# Patient Record
Sex: Male | Born: 1970 | Race: White | Hispanic: Yes | Marital: Married | Smoking: Never smoker
Health system: Southern US, Community
[De-identification: ages and names within clinical notes are randomized; demographics above are authoritative.]

## PROBLEM LIST (undated history)

## (undated) DIAGNOSIS — E119 Type 2 diabetes mellitus without complications: Secondary | ICD-10-CM

## (undated) DIAGNOSIS — I10 Essential (primary) hypertension: Secondary | ICD-10-CM

## (undated) DIAGNOSIS — E785 Hyperlipidemia, unspecified: Secondary | ICD-10-CM

## (undated) HISTORY — PX: APPENDECTOMY: SHX54

---

## 2008-01-26 ENCOUNTER — Emergency Department: Payer: Self-pay | Admitting: Emergency Medicine

## 2012-05-24 ENCOUNTER — Ambulatory Visit: Payer: Self-pay

## 2012-06-29 ENCOUNTER — Ambulatory Visit: Payer: Self-pay | Admitting: Internal Medicine

## 2014-12-09 ENCOUNTER — Emergency Department
Admission: EM | Admit: 2014-12-09 | Discharge: 2014-12-09 | Disposition: A | Discharge status: Home / Self Care | Payer: No Typology Code available for payment source | Attending: Emergency Medicine | Admitting: Emergency Medicine

## 2014-12-09 ENCOUNTER — Encounter: Payer: Self-pay | Admitting: *Deleted

## 2014-12-09 DIAGNOSIS — S3992XA Unspecified injury of lower back, initial encounter: Secondary | ICD-10-CM | POA: Diagnosis present

## 2014-12-09 DIAGNOSIS — Y998 Other external cause status: Secondary | ICD-10-CM | POA: Insufficient documentation

## 2014-12-09 DIAGNOSIS — Y9389 Activity, other specified: Secondary | ICD-10-CM | POA: Insufficient documentation

## 2014-12-09 DIAGNOSIS — Y92410 Unspecified street and highway as the place of occurrence of the external cause: Secondary | ICD-10-CM | POA: Diagnosis not present

## 2014-12-09 DIAGNOSIS — M62830 Muscle spasm of back: Secondary | ICD-10-CM

## 2014-12-09 MED ORDER — CYCLOBENZAPRINE HCL 10 MG PO TABS: 10.0000 mg | ORAL_TABLET | Freq: Once | ORAL | Status: DC

## 2014-12-09 MED ORDER — CYCLOBENZAPRINE HCL 10 MG PO TABS: 10.0000 mg | ORAL_TABLET | Freq: Three times a day (TID) | ORAL | Status: DC | PRN

## 2014-12-09 MED ORDER — CYCLOBENZAPRINE HCL 10 MG PO TABS
10.0000 mg | ORAL_TABLET | Freq: Once | ORAL | Status: AC
  Administered 2014-12-09: 10 mg via ORAL

## 2014-12-09 MED ORDER — CYCLOBENZAPRINE HCL 10 MG PO TABS
ORAL_TABLET | ORAL | Status: AC
  Administered 2014-12-09: 10 mg via ORAL
  Filled 2014-12-09: qty 1

## 2014-12-09 MED ORDER — KETOROLAC TROMETHAMINE 10 MG PO TABS
ORAL_TABLET | ORAL | Status: AC
  Administered 2014-12-09: 10 mg via ORAL
  Filled 2014-12-09: qty 1

## 2014-12-09 MED ORDER — KETOROLAC TROMETHAMINE 10 MG PO TABS
10.0000 mg | ORAL_TABLET | Freq: Once | ORAL | Status: AC
  Administered 2014-12-09: 10 mg via ORAL
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
## 2014-12-09 NOTE — Discharge Instructions (Signed)
Dolor músculoesquelético °(Musculoskeletal Pain) °El dolor musculoesquelético se siente en huesos y músculos. El dolor puede ocurrir en cualquier parte del cuerpo. El profesional que lo asiste podrá tratarlo sin conocer la causa del dolor. Lo tratará aunque las pruebas de laboratorio (sangre y orina), las radiografías y otros estudios sean normales. La causa de estos dolores puede ser un virus.  °CAUSAS °Generalmente no existe una causa definida para este trastorno. También el malestar puede deberse a la actividad excesiva. En la actividad excesiva se incluye el hacer ejercicios físicos muy intensos cuando no se está en buena forma. El dolor de huesos también puede deberse a cambios climáticos. Los huesos son sensibles a los cambios en la presión atmosférica. °INSTRUCCIONES PARA EL CUIDADO DOMICILIARIO °· Para proteger su privacidad, no se entregarán los resultados de las pruebas por teléfono. Asegúrese de conseguirlos. Consulte el modo en que podrá obtenerlos si no se lo han informado. Es su responsabilidad contar con los resultados de las pruebas. °· Utilice los medicamentos de venta libre o de prescripción para el dolor, el malestar o la fiebre, según se lo indique el profesional que lo asiste. °Si le han administrado medicamentos, no conduzca, no opere maquinarias ni herramientas eléctricas, y tampoco firme documentos legales durante 24 horas. No beba alcohol. No tome píldoras para dormir ni otros medicamentos que puedan interferir en el tratamiento. °· Podrá seguir con todas las actividades a menos que éstas le ocasionen más dolor. Cuando el dolor disminuya, es importante que gradualmente reanude toda la rutina habitual. Retome las actividades comenzando lentamente. Aumente gradualmente la intensidad y la duración de sus actividades o del ejercicio. °· Durante los períodos de dolor intenso, el reposo en cama puede ser beneficioso. Recuéstese o siéntese en la posición que le sea más cómoda. °· Coloque hielo  sobre la zona afectada. °¨ Ponga hielo en una bolsa. °¨ Colóquese una toalla entre la piel y la bolsa de hielo. °¨ Aplique el hielo durante 10 a 20 minutos 3 ó 4 veces por día. °· Si el dolor empeora, o no desaparece puede ser necesario repetir las pruebas o realizar nuevos exámenes. El profesional que lo asiste podrá requerir investigar más profundamente para encontrar la causa posible. °SOLICITE ATENCIÓN MÉDICA DE INMEDIATO SI: °· Siente que el dolor empeora y no se alivia con los medicamentos. °· Siente dolor en el pecho asociado a falta de aire, sudoración, náuseas o vómitos. °· El dolor se localiza en el abdomen. °· Comienza a sentir nuevos síntomas que parecen ser diferentes o que lo preocupan. °ASEGÚRESE DE QUE:  °· Comprende las instrucciones para el alta médica. °· Controlará su enfermedad. °· Solicitará atención médica de inmediato según las indicaciones. °Document Released: 02/02/2005 Document Revised: 07/18/2011 °ExitCare® Patient Information ©2015 ExitCare, LLC. This information is not intended to replace advice given to you by your health care provider. Make sure you discuss any questions you have with your health care provider. ° °
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
## 2015-08-02 ENCOUNTER — Emergency Department: Payer: Managed Care, Other (non HMO)

## 2015-08-02 ENCOUNTER — Encounter: Payer: Self-pay | Admitting: Emergency Medicine

## 2015-08-02 ENCOUNTER — Emergency Department
Admission: EM | Admit: 2015-08-02 | Discharge: 2015-08-02 | Disposition: A | Discharge status: Home / Self Care | Payer: Managed Care, Other (non HMO) | Attending: Emergency Medicine | Admitting: Emergency Medicine

## 2015-08-02 DIAGNOSIS — M79604 Pain in right leg: Secondary | ICD-10-CM | POA: Diagnosis present

## 2015-08-02 DIAGNOSIS — M5431 Sciatica, right side: Secondary | ICD-10-CM | POA: Diagnosis not present

## 2015-08-02 MED ORDER — OXYCODONE-ACETAMINOPHEN 5-325 MG PO TABS
1.0000 | ORAL_TABLET | ORAL | Status: DC | PRN
  Administered 2015-08-02: 1 via ORAL

## 2015-08-02 MED ORDER — HYDROMORPHONE HCL 1 MG/ML IJ SOLN
1.0000 mg | Freq: Once | INTRAMUSCULAR | Status: AC
  Administered 2015-08-02: 1 mg via INTRAMUSCULAR
  Filled 2015-08-02: qty 1

## 2015-08-02 MED ORDER — ONDANSETRON 4 MG PO TBDP
4.0000 mg | ORAL_TABLET | Freq: Once | ORAL | Status: AC
  Administered 2015-08-02: 4 mg via ORAL
  Filled 2015-08-02: qty 1

## 2015-08-02 MED ORDER — OXYCODONE-ACETAMINOPHEN 5-325 MG PO TABS: 1.0000 | ORAL_TABLET | ORAL | Status: DC | PRN

## 2015-08-02 MED ORDER — OXYCODONE-ACETAMINOPHEN 5-325 MG PO TABS
ORAL_TABLET | ORAL | Status: AC
  Filled 2015-08-02: qty 1

## 2015-08-02 NOTE — Discharge Instructions (Signed)
Return to the emergency department for any worsening pain, new weakness or numbness, any numbness of the rectal or groin area, any problems probing or urinating on yourself as these could be signs of more serious condition.  Continue your current medications, and I am adding an additional pain medicine, Percocet. Do not take additional Tylenol with this. Do not drive or operate machinery. Limit bending or lifting, and standing for a week to help you. Follow-up with her primary doctor within 1 week, you may the scheduled for imaging of the spine such as an MRI as an neck step.   Citica  (Sciatica)  La citica es Chief Technology Officer, debilidad, entumecimiento u hormigueo a lo largo del nervio citico. El nervio comienza en la zona inferior de la espalda y desciende por la parte posterior de cada pierna. El nervio controla los msculos de la parte inferior de la pierna y de la zona posterior de la rodilla, y transmite la sensibilidad a la parte posterior del muslo, la pierna y la planta del pie. La citica es un sntoma de otras afecciones mdicas. Por ejemplo, un dao a los nervios o algunas enfermedades como un disco herniado o un espoln seo en la columna vertebral, podran daarle o presionar en el nervio citico. Esto causa dolor, debilidad y otras sensaciones normalmente asociadas con la citica. Generalmente la citica afecta slo un lado del cuerpo. CAUSAS   Disco herniado o desplazado.  Enfermedad degenerativa del disco.  Un sndrome doloroso que compromete un msculo angosto de los glteos (sndrome piriforme).  Lesin o fractura plvica.  Embarazo.  Tumor (casos raros). SNTOMAS  Los sntomas pueden variar de leves a muy graves. Por lo general, los sntomas descienden desde la zona lumbar a las nalgas y la parte posterior de la pierna. Ellos son:   Hormigueo leve o dolor sordo en la parte inferior de la espalda, la pierna o la cadera.  Adormecimiento en la parte posterior de la pantorrilla o  la planta del pie.  Sensacin de [employer] zona lumbar, la pierna o la cadera.  Dolor agudo en la zona inferior de la espalda, la pierna o la cadera.  Debilidad en las piernas.  Dolor de espalda intenso que [employer] movimientos. Los sntomas pueden empeorar al toser, Engineering geologist, rer o estar sentado o parado durante [employer]. Adems, el sobrepeso puede empeorar los sntomas.  DIAGNSTICO  Su mdico le har un examen fsico para buscar los sntomas comunes de la citica. Le pedir que haga algunos movimientos o actividades que activaran el dolor del nervio citico. Para encontrar las causas de la citica podr indicarle otros estudios. Estos pueden ser:   Anlisis de [employer].  Radiografas.  Pruebas de diagnstico por imgenes, como resonancia magntica o tomografa computada. TRATAMIENTO  El tratamiento se dirige a las causas de la citica. A veces, el tratamiento no es necesario, y Chief Technology Officer y Environmental health practitioner desaparecen por s mismos. Si necesita tratamiento, su mdico puede sugerir:   Medicamentos de venta libre para Engineer, materials.  Medicamentos recetados, como antiinflamatorios, relajantes musculares o narcticos.  Aplicacin de calor o hielo en la zona del dolor.  Inyecciones de corticoides para disminuir el dolor, la irritacin y la inflamacin alrededor del nervio.  Reduccin de la [employer] perodos de [place].  Ejercicios y estiramiento del abdomen para fortalecer y Scientist, clinical (histocompatibility and immunogenetics) la flexibilidad de la columna vertebral. Su mdico puede sugerirle perder peso si el peso extra empeora el dolor de espalda.  Fisioterapia.  La  ciruga para eliminar lo que presiona o pincha el nervio, como un espoln seo o parte de una hernia de disco. INSTRUCCIONES PARA EL CUIDADO EN EL HOGAR   Slo tome medicamentos de venta libre o recetados para Primary school teachercalmar el dolor o Environmental health practitionerel malestar, segn las indicaciones de su mdico.  Aplique hielo sobre el rea dolorida durante 20 minutos 3-4 veces  por da durante los primeras 48-72 horas. Luego intente aplicar calor de la misma manera.  Haga ejercicios, elongue o realice sus actividades habituales, si no le causan ms dolor.  Cumpla con todas las sesiones de fisioterapia, segn le indique su mdico.  Cumpla con todas las visitas de control, segn le indique su mdico.  No use tacones altos o zapatos que no tengan buen apoyo.  Verifique que el colchn no sea muy blando. Un colchn firme Engineer, materialsaliviar el dolor y las Chums Cornermolestias. SOLICITE ATENCIN MDICA DE INMEDIATO SI:   Pierde el control de la vejiga o del intestino (incontinencia).  Aumenta la debilidad en la zona inferior de la espalda, la pelvis, las nalgas o las piernas.  Siente irritacin o inflamacin en la espalda.  Tiene sensacin de ardor al ConocoPhillipsorinar.  El dolor empeora cuando se acuesta o lo despierta por la noche.  El dolor es peor del que experiment en el pasado.  Dura ms de 4 semanas.  Pierde peso sin motivo de Jaspermanera sbita. ASEGRESE DE QUE:   Comprende estas instrucciones.  Controlar su enfermedad.  Solicitar ayuda de inmediato si no mejora o si empeora.   Esta informacin no tiene Theme park managercomo fin reemplazar el consejo del mdico. Asegrese de hacerle al mdico cualquier pregunta que tenga.   Document Released: 04/25/2005 Document Revised: 01/14/2015 Elsevier Interactive Patient Education [employer]2016 Elsevier Inc.

## 2015-08-02 NOTE — ED Provider Notes (Signed)
[facility] Emergency Department Provider Note   ____________________________________________  Time seen: Approximately 6 AM I have reviewed the triage vital signs and the triage nursing note.  HISTORY  Chief Complaint Leg Pain   Historian Patient  HPI Victor Campos is a 45 y.o. male who is here for evaluation of right leg pain. Patient states that pain started in his right buttock and extends down his leg about 2 weeks ago. He saw his primary care physician who started him on Naprosyn. Previous to that he had been taking ibuprofen. He didn't go back for reevaluation on this past Thursday just a few days ago, and was started on prednisone.  He does bend and lift for his job, but does not report a specific injury or overuse. No history of degenerative disc disease, or prior radicular pain. No fever.  He has been having some paresthesias, but no weakness. No incontinence of stool or urine. Pain is worse when he sits or bends. He is currently having moderate to severe pain. He is pointing now more to his posterior/posterior knee area. There is been no skin rash.    History reviewed. No pertinent past medical history.  There are no active problems to display for this patient.   History reviewed. No pertinent past surgical history.  Current Outpatient Rx  Name  Route  Sig  Dispense  Refill  . cyclobenzaprine (FLEXERIL) 10 MG tablet   Oral   Take 1 tablet (10 mg total) by mouth 3 (three) times daily as needed for muscle spasms.   30 tablet   0   . oxyCODONE-acetaminophen (ROXICET) 5-325 MG tablet   Oral   Take 1 tablet by mouth every 4 (four) hours as needed for severe pain.   10 tablet   0     Allergies Penicillins  No family history on file.  Social History Social History  Substance Use Topics  . Smoking status: Never Smoker   . Smokeless tobacco: None  . Alcohol Use: No    Review of Systems  Constitutional: Negative for  fever. Eyes: Negative for visual changes. ENT: Negative for sore throat. Cardiovascular: Negative for chest pain. Respiratory: Negative for shortness of breath. Gastrointestinal: Negative for abdominal pain, vomiting and diarrhea. Genitourinary: Negative for dysuria. Musculoskeletal: Mild right-sided low back pain more towards the buttock area. Skin: Negative for rash. Neurological: Negative for headache. 10 point Review of Systems otherwise negative ____________________________________________   PHYSICAL EXAM:  VITAL SIGNS: ED Triage Vitals  Enc Vitals Group     BP 08/02/15 0143 151/97 mmHg     Pulse Rate 08/02/15 0143 99     Resp 08/02/15 0143 18     Temp 08/02/15 0143 97.8 F (36.6 C)     Temp Source 08/02/15 0143 Oral     SpO2 08/02/15 0143 95 %     Weight 08/02/15 0143 199 lb (90.266 kg)     Height 08/02/15 0143  (1.549 m)     Head Cir --      Peak Flow --      Pain Score 08/02/15 0144 9     Pain Loc --      Pain Edu? --      Excl. in GC? --      Constitutional: Alert and oriented. Well appearing overall but pacing and has trouble sitting down due to pain in his right lower extremity. HEENT   Head: Normocephalic and atraumatic.      Eyes: Conjunctivae are normal.  PERRL. Normal extraocular movements.      Ears:         Nose: No congestion/rhinnorhea.   Mouth/Throat: Mucous membranes are moist.   Neck: No stridor. Cardiovascular/Chest: Normal rate, regular rhythm.  No murmurs, rubs, or gallops. Respiratory: Normal respiratory effort without tachypnea nor retractions. Breath sounds are clear and equal bilaterally. No wheezes/rales/rhonchi. Gastrointestinal: Soft. No distention, no guarding, no rebound. Nontender.    Genitourinary/rectal:Deferred Musculoskeletal: Nontender with normal range of motion in all extremities. No joint effusions.  No lower extremity tenderness.  No edema. Neurologic:  Normal speech and language. Able to walk. Subjective  paresthesias right lower extremity. No weakness appreciated. Skin:  Skin is warm, dry and intact. No rash noted. Psychiatric: Mood and affect are normal. Speech and behavior are normal. Patient exhibits appropriate insight and judgment.  ____________________________________________   EKG I, Governor Rooksebecca Trestin Vences, MD, the attending physician have personally viewed and interpreted all ECGs.  None ____________________________________________  LABS (pertinent positives/negatives)  None  ____________________________________________  RADIOLOGY All Xrays were viewed by me. Imaging interpreted by Radiologist.  Ultrasound lower extremity right: No evidence of DVT  Lumbar spine x-ray: Negative __________________________________________  PROCEDURES  Procedure(s) performed: None  Critical Care performed: None  ____________________________________________   ED COURSE / ASSESSMENT AND PLAN  Pertinent labs & imaging results that were available during my care of the patient were reviewed by me and considered in my medical decision making (see chart for details).  The patient's description of symptoms and physical exam are most consistent with right-sided sciatica. He doesn't have any historical features or findings that make me concerned about a more central process such as cauda equina, or other spinal emergency.  He does have a complaint of some paresthesias in the right lower extremity, which I do think are probably still related to sciatica.  In terms of pain control, he is on prednisone and anti-inflammatory, I will add Percocet for some short-term relief. Patient to follow up with his primary care doctor this week, and I discussed with him likely next step would be MRI of the spine.    CONSULTATIONS:   None   Patient / Family / Caregiver informed of clinical course, medical decision-making process, and agree with plan.   I discussed return precautions, follow-up instructions, and  discharged instructions with patient and/or family.   ___________________________________________   FINAL CLINICAL IMPRESSION(S) / ED DIAGNOSES   Final diagnoses:  Sciatica, right              Note: This dictation was prepared with Dragon dictation. Any transcriptional errors that result from this process are unintentional   Governor Rooksebecca Denisha Hoel, MD 08/02/15 [phone]

## 2015-08-02 NOTE — ED Notes (Signed)
Unable to E sign. Pt verbalized understanding of DC instructions and all paperwork.  All questions answered.

## 2015-08-02 NOTE — ED Notes (Addendum)
Patient states that he developed right leg pain about 2 weeks ago that starts in his hip and radiated down his right leg. Patient states that he has seen his pcp and started on naproxen with no improvement. Patient reports that he went back to his pcp on Thursday and was started on prednisone and the pain is worse tonight. Patient denies any injury.

## 2015-08-02 NOTE — ED Notes (Signed)
Patient transported to Ultrasound 

## 2017-04-23 IMAGING — US US EXTREM LOW VENOUS*R*
1 series · 13 of 24 positions shown · non-contrast
Comparison: None.

CLINICAL DATA: behind the knee leg pain



[Series 1: us extrem low venous*right* · 0.08mm/px · 13 of 31 slices shown]
[im 1/31]
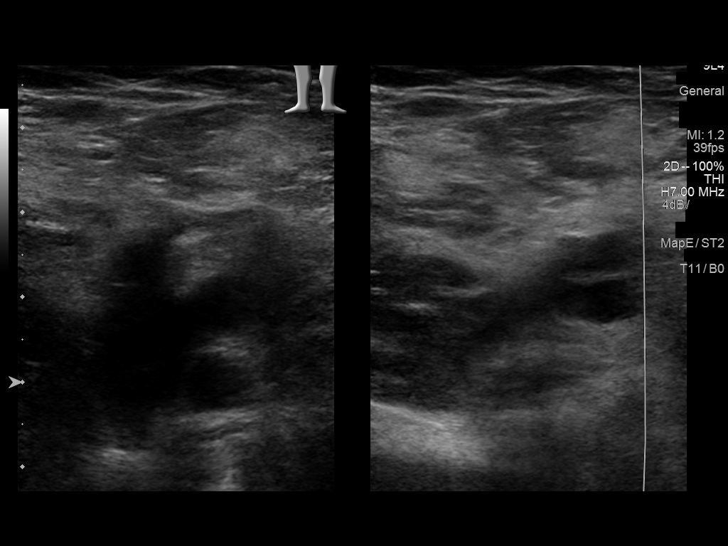
[im 3/31]
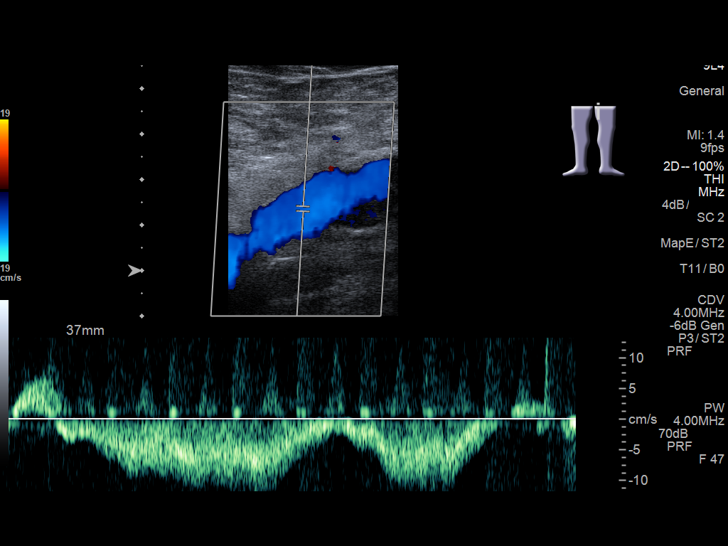
[im 6/31]
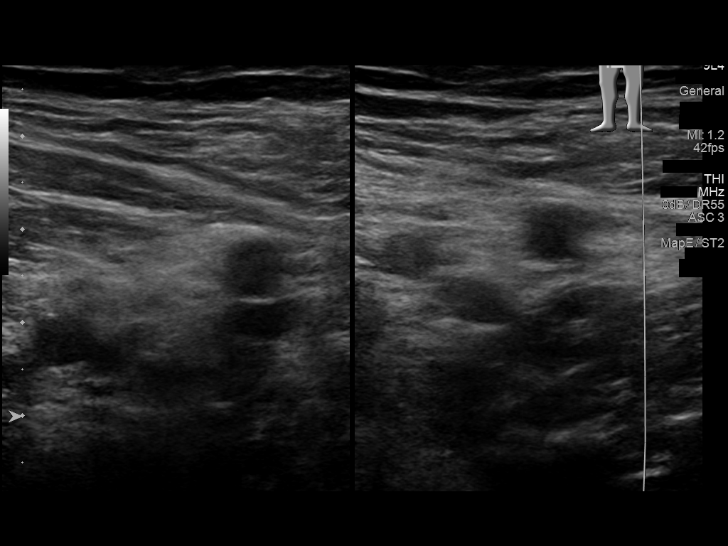
[im 8/31]
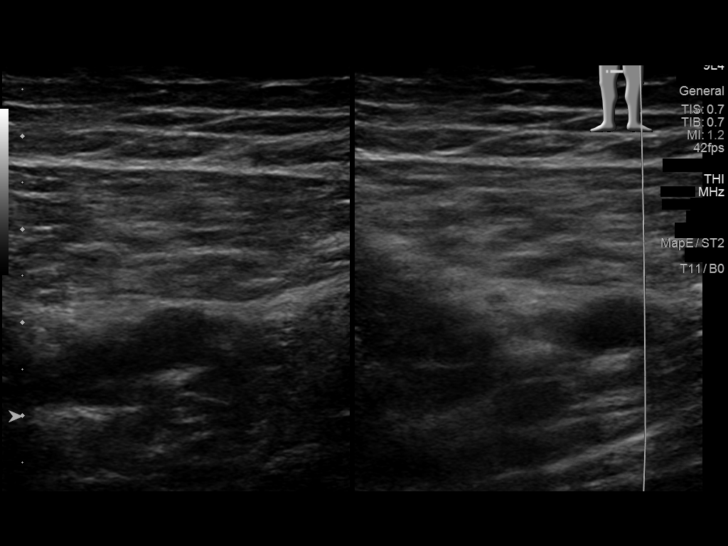
[im 11/31]
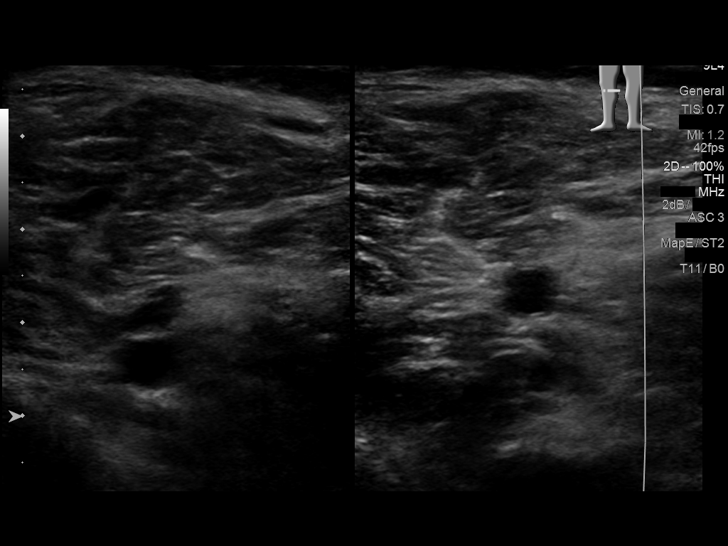
[im 14/31]
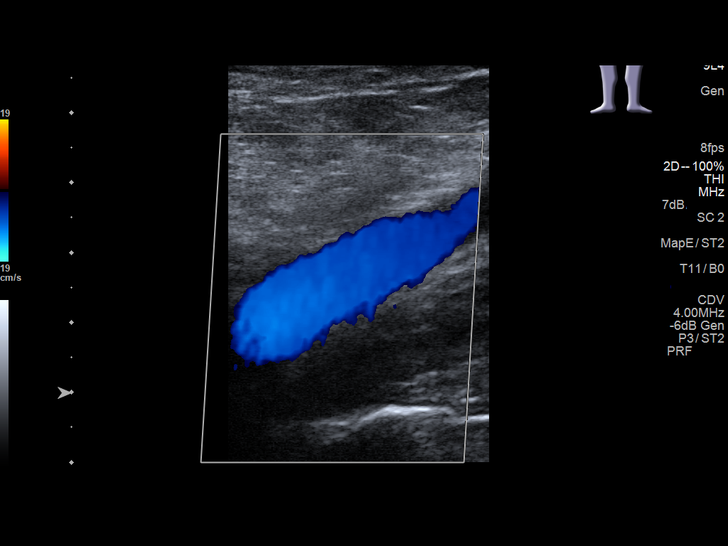
[im 16/31]
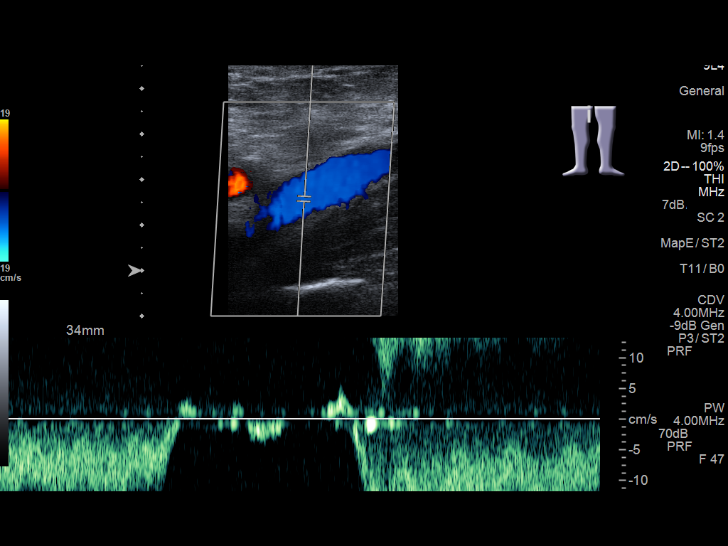
[im 17/31]
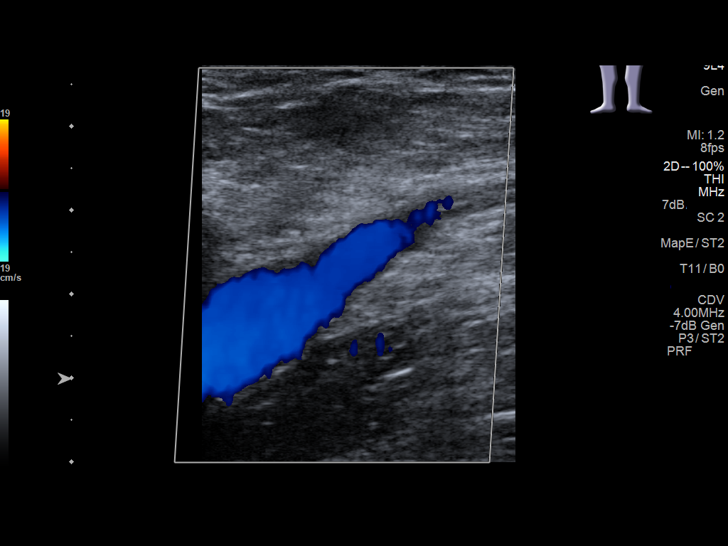
[im 20/31]
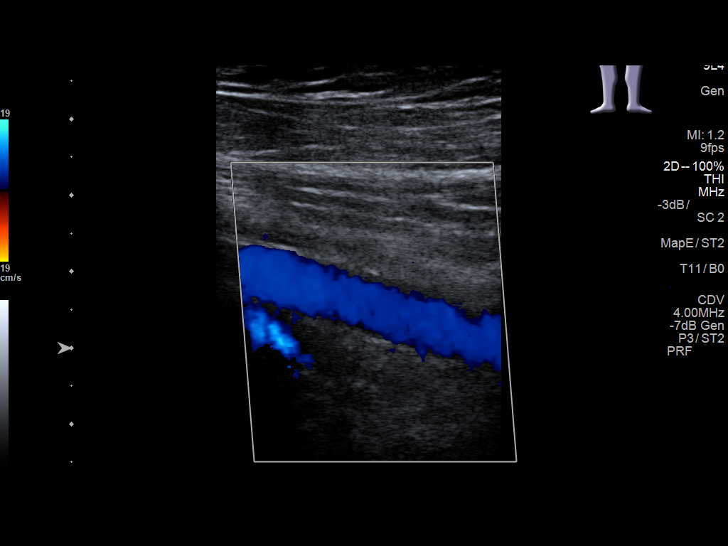
[im 23/31]
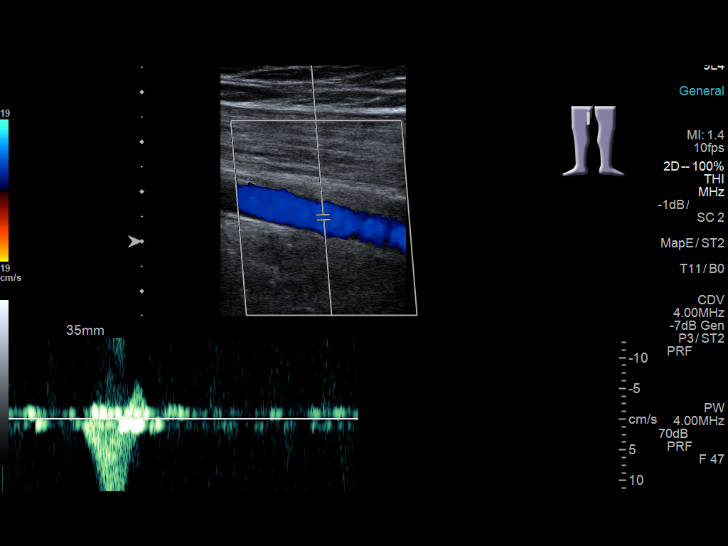
[im 25/31]
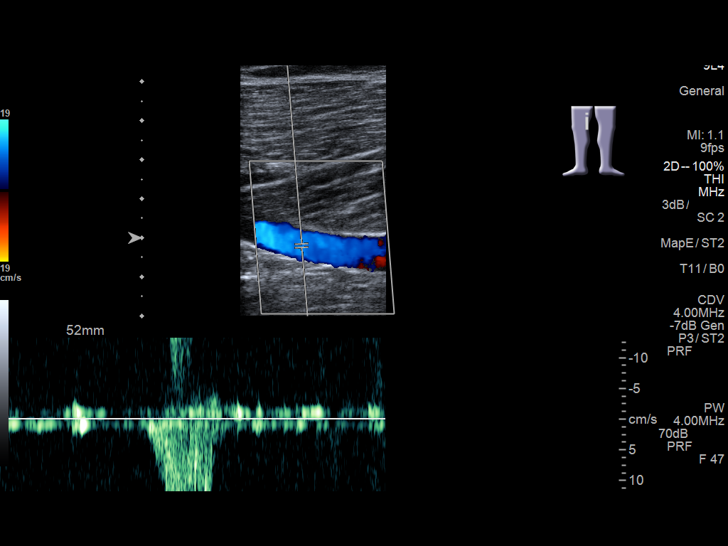
[im 28/31]
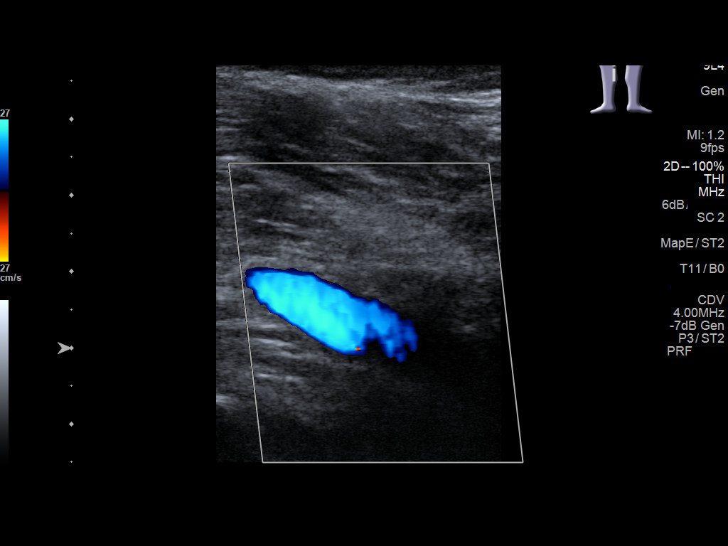
[im 31/31]
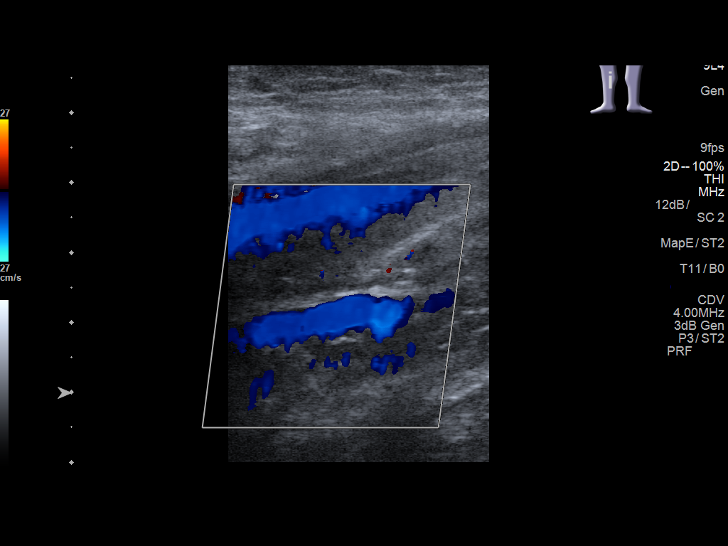

[13 of 24 positions shown; findings below may reference images not displayed]

FINDINGS: Contralateral Common Femoral Vein: Respiratory phasicity is normal
and symmetric with the symptomatic side. No evidence of thrombus.
Normal compressibility.

Common Femoral Vein: No evidence of thrombus. Normal
compressibility, respiratory phasicity and response to augmentation.

Saphenofemoral Junction: No evidence of thrombus. Normal
compressibility and flow on color Doppler imaging.

Profunda Femoral Vein: No evidence of thrombus. Normal
compressibility and flow on color Doppler imaging.

Femoral Vein: No evidence of thrombus. Normal compressibility,
respiratory phasicity and response to augmentation.

Popliteal Vein: No evidence of thrombus. Normal compressibility,
respiratory phasicity and response to augmentation.

Calf Veins: No evidence of thrombus. Normal compressibility and flow
on color Doppler imaging.

Superficial Great Saphenous Vein: No evidence of thrombus. Normal
compressibility and flow on color Doppler imaging.

Venous Reflux:  None.

Other Findings:  None.
IMPRESSION: No evidence of deep venous thrombosis.

## 2017-08-20 IMAGING — CR DG LUMBAR SPINE 2-3V
3 series · 3 of 3 positions shown · non-contrast
Comparison: None.

CLINICAL DATA: Patient states that he developed right leg pain
about 2 weeks ago that starts in his hip and radiated down his right
leg. Patient states that he has seen his pcp and started on naproxen
with no improvement.

EXAM:
LUMBAR SPINE - 2-3 VIEW

[l-spine ap]
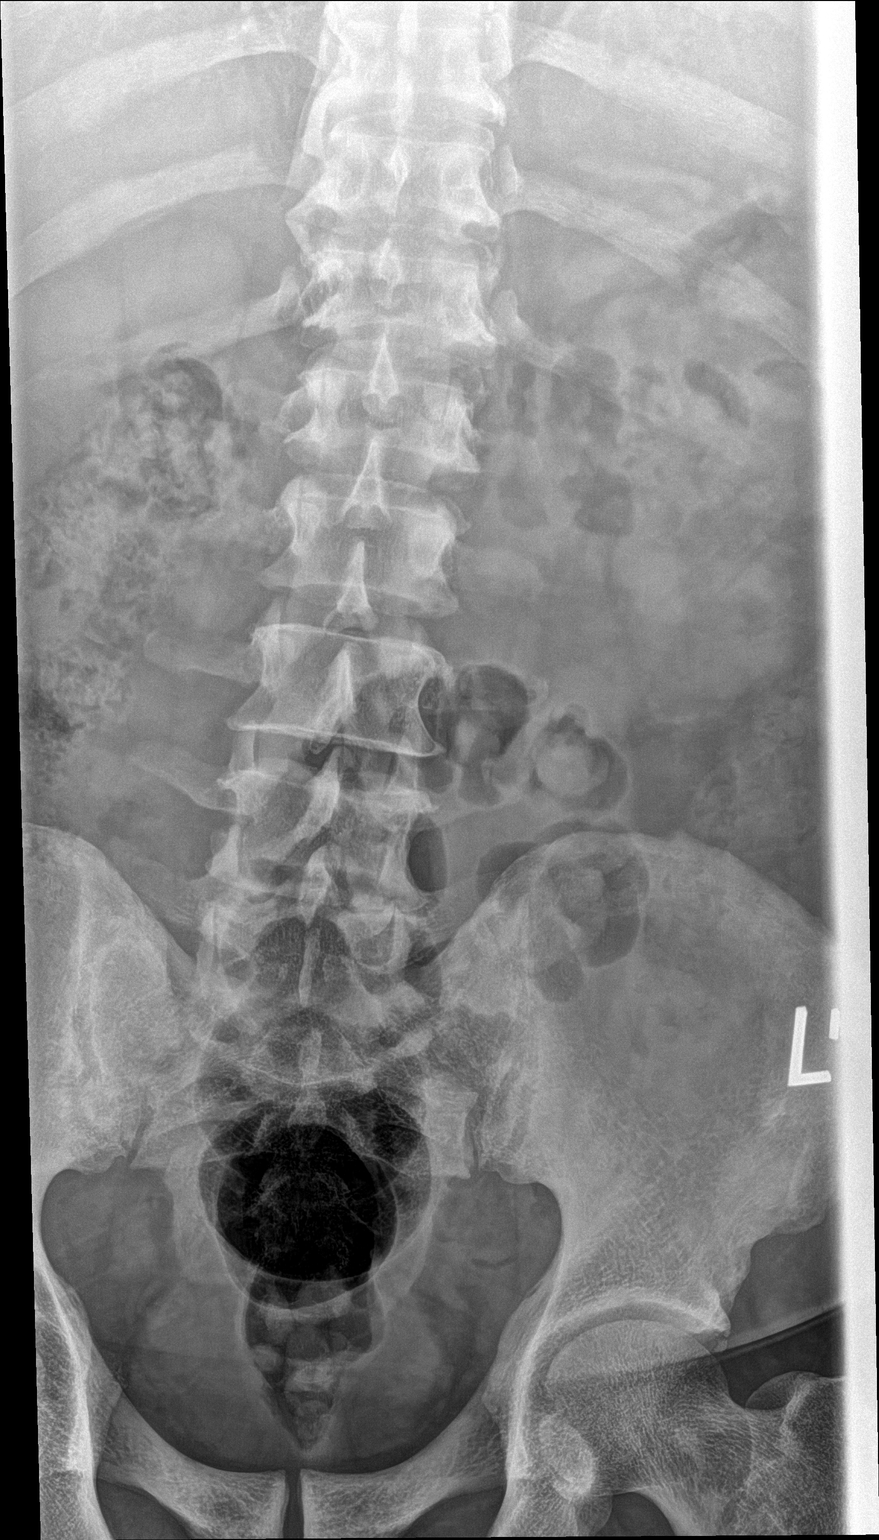

[l-spine lat]
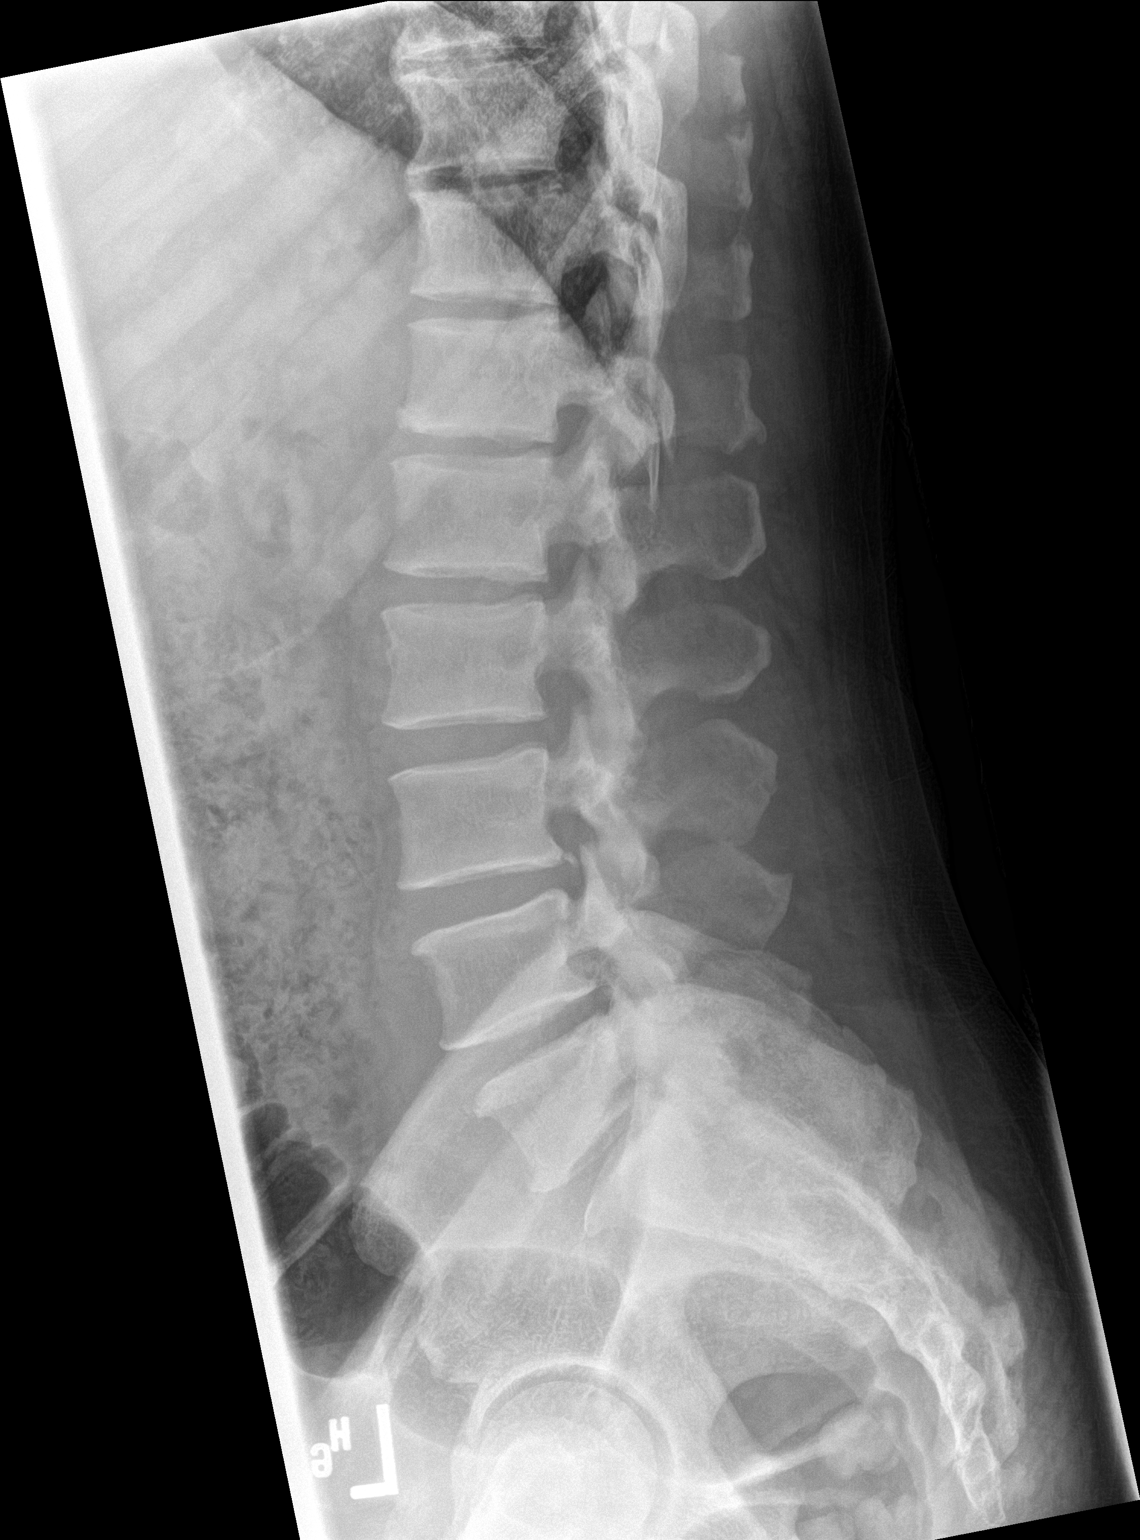

[l-spine spot]
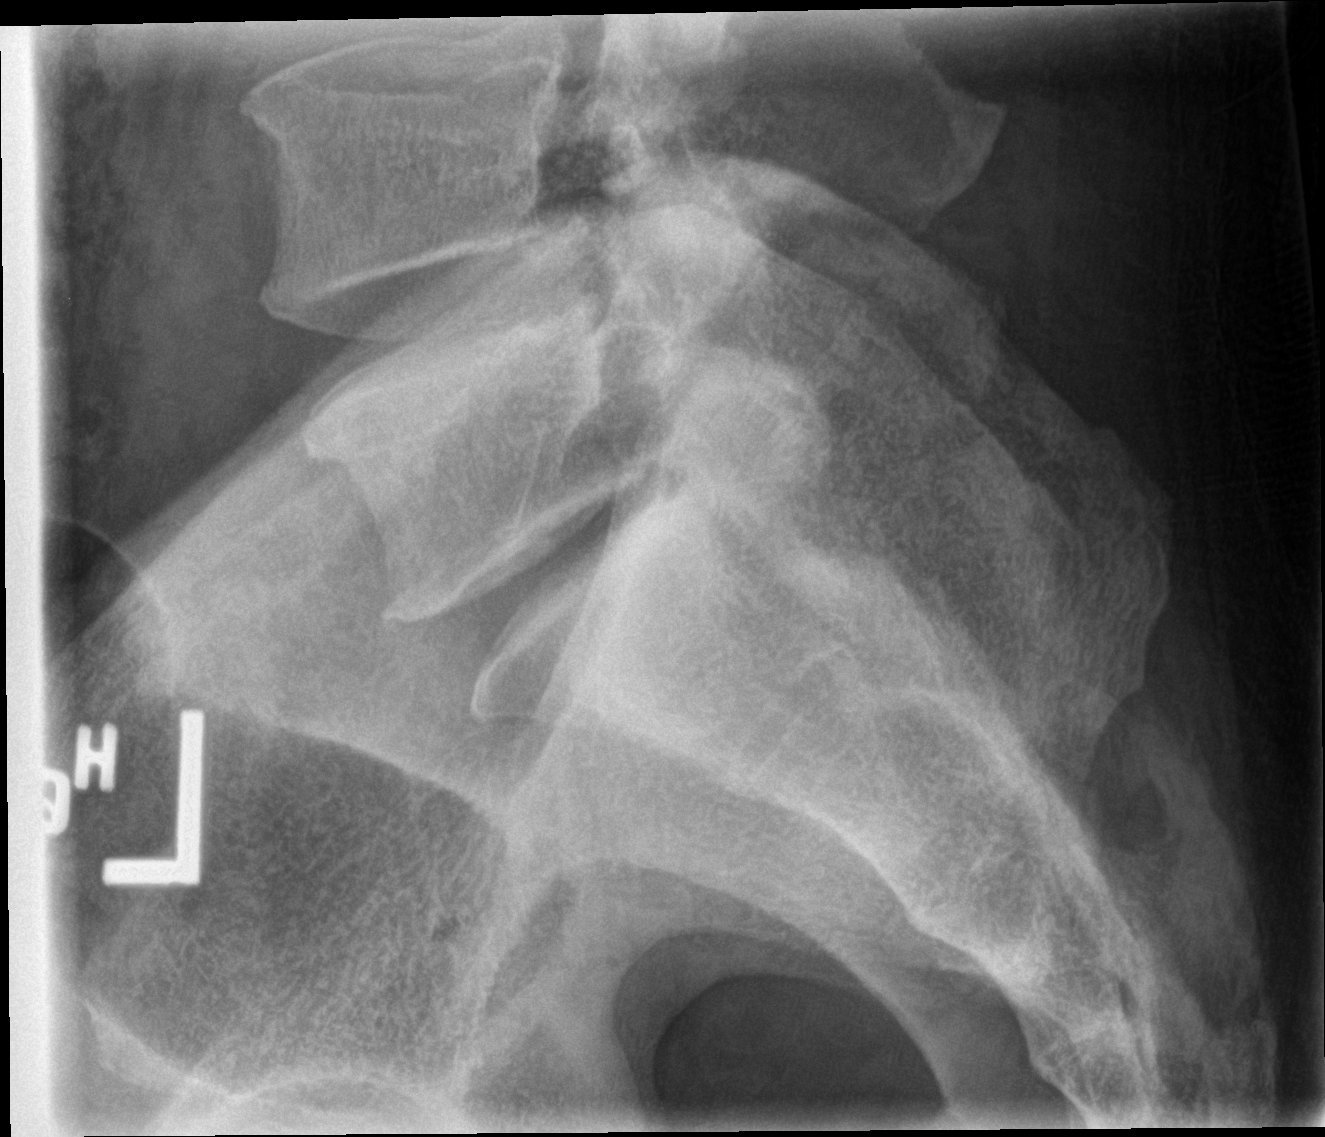

[3 of 3 positions shown; findings below may reference images not displayed]

FINDINGS: There is no evidence of lumbar spine fracture. Alignment is normal.
Intervertebral disc spaces are maintained except for slight
narrowing at L4-5 and L5-S1. Annular calcification L3-L4.
IMPRESSION: Negative.

## 2019-07-23 ENCOUNTER — Encounter: Payer: Self-pay | Admitting: Family Medicine

## 2020-05-29 ENCOUNTER — Other Ambulatory Visit: Payer: Self-pay

## 2020-05-29 ENCOUNTER — Telehealth (INDEPENDENT_AMBULATORY_CARE_PROVIDER_SITE_OTHER): Payer: Self-pay | Admitting: Gastroenterology

## 2020-05-29 DIAGNOSIS — Z1211 Encounter for screening for malignant neoplasm of colon: Secondary | ICD-10-CM

## 2020-05-29 MED ORDER — NA SULFATE-K SULFATE-MG SULF 17.5-3.13-1.6 GM/177ML PO SOLN: 1.0000 | Freq: Once | ORAL | 0 refills | Status: AC

## 2020-05-29 NOTE — Progress Notes (Signed)
Gastroenterology Pre-Procedure Review  Request Date: Thursday 06/11/20 Requesting Physician: Dr. Allegra Lai  PATIENT REVIEW QUESTIONS: The patient responded to the following health history questions as indicated:    1. Are you having any GI issues? no 2. Do you have a personal history of Polyps? NO 3. Do you have a family history of Colon Cancer or Polyps? no 4. Diabetes Mellitus? no 5. Joint replacements in the past 12 months?no 6. Major health problems in the past 3 months?no 7. Any artificial heart valves, MVP, or defibrillator?no    MEDICATIONS & ALLERGIES:    Patient reports the following regarding taking any anticoagulation/antiplatelet therapy:   Plavix, Coumadin, Eliquis, Xarelto, Lovenox, Pradaxa, Brilinta, or Effient? no Aspirin? no  Patient confirms/reports the following medications:  Current Outpatient Medications  Medication Sig Dispense Refill  . aspirin EC 81 MG tablet Take 81 mg by mouth daily. Swallow whole.    Marland Kitchen atorvastatin (LIPITOR) 40 MG tablet Take 40 mg by mouth daily.    . cyclobenzaprine (FLEXERIL) 10 MG tablet Take 1 tablet (10 mg total) by mouth 3 (three) times daily as needed for muscle spasms. 30 tablet 0  . IBUPROFEN PO Take by mouth.    Marland Kitchen lisinopril (ZESTRIL) 2.5 MG tablet Take 2.5 mg by mouth daily.    . metFORMIN (GLUCOPHAGE) 500 MG tablet Take by mouth 2 (two) times daily with a meal.    . oxyCODONE-acetaminophen (ROXICET) 5-325 MG tablet Take 1 tablet by mouth every 4 (four) hours as needed for severe pain. (Patient not taking: No sig reported) 10 tablet 0   No current facility-administered medications for this visit.    Patient confirms/reports the following allergies:  Allergies  Allergen Reactions  . Penicillins Rash    No orders of the defined types were placed in this encounter.   AUTHORIZATION INFORMATION Primary Insurance: 1D#: Group #:  Secondary Insurance: 1D#: Group #:  SCHEDULE INFORMATION: Date: Thursday  06/11/20 Time: Location:[facility]

## 2020-06-02 ENCOUNTER — Other Ambulatory Visit: Payer: Self-pay

## 2020-06-02 ENCOUNTER — Encounter: Payer: Self-pay | Admitting: Family Medicine

## 2020-06-02 DIAGNOSIS — Z1211 Encounter for screening for malignant neoplasm of colon: Secondary | ICD-10-CM

## 2020-06-02 MED ORDER — NA SULFATE-K SULFATE-MG SULF 17.5-3.13-1.6 GM/177ML PO SOLN: 1.0000 | Freq: Once | ORAL | 0 refills | Status: AC

## 2020-06-09 ENCOUNTER — Other Ambulatory Visit: Payer: Self-pay

## 2020-06-09 ENCOUNTER — Other Ambulatory Visit
Admission: RE | Admit: 2020-06-09 | Discharge: 2020-06-09 | Disposition: A | Discharge status: Home / Self Care | Payer: BC Managed Care – PPO | Source: Ambulatory Visit | Attending: Gastroenterology | Admitting: Gastroenterology

## 2020-06-09 DIAGNOSIS — Z79899 Other long term (current) drug therapy: Secondary | ICD-10-CM | POA: Diagnosis not present

## 2020-06-09 DIAGNOSIS — Z1211 Encounter for screening for malignant neoplasm of colon: Secondary | ICD-10-CM | POA: Diagnosis present

## 2020-06-09 DIAGNOSIS — Z01812 Encounter for preprocedural laboratory examination: Secondary | ICD-10-CM | POA: Insufficient documentation

## 2020-06-09 DIAGNOSIS — Z7984 Long term (current) use of oral hypoglycemic drugs: Secondary | ICD-10-CM | POA: Diagnosis not present

## 2020-06-09 DIAGNOSIS — Z20822 Contact with and (suspected) exposure to covid-19: Secondary | ICD-10-CM | POA: Diagnosis not present

## 2020-06-09 DIAGNOSIS — Z791 Long term (current) use of non-steroidal anti-inflammatories (NSAID): Secondary | ICD-10-CM | POA: Diagnosis not present

## 2020-06-09 DIAGNOSIS — Z880 Allergy status to penicillin: Secondary | ICD-10-CM | POA: Diagnosis not present

## 2020-06-09 DIAGNOSIS — Z7982 Long term (current) use of aspirin: Secondary | ICD-10-CM | POA: Diagnosis not present

## 2020-06-09 DIAGNOSIS — K644 Residual hemorrhoidal skin tags: Secondary | ICD-10-CM | POA: Diagnosis not present

## 2020-06-09 LAB — SARS CORONAVIRUS 2 (TAT 6-24 HRS): SARS Coronavirus 2: NEGATIVE

## 2020-06-10 ENCOUNTER — Encounter: Payer: Self-pay | Admitting: Gastroenterology

## 2020-06-11 ENCOUNTER — Encounter
Admission: RE | Disposition: A | Discharge status: Home / Self Care | Payer: Self-pay | Source: Ambulatory Visit | Attending: Gastroenterology

## 2020-06-11 ENCOUNTER — Ambulatory Visit
Admission: RE | Admit: 2020-06-11 | Discharge: 2020-06-11 | Disposition: A | Discharge status: Home / Self Care | Payer: BC Managed Care – PPO | Source: Ambulatory Visit | Attending: Gastroenterology | Admitting: Gastroenterology

## 2020-06-11 ENCOUNTER — Encounter: Payer: Self-pay | Admitting: Gastroenterology

## 2020-06-11 ENCOUNTER — Ambulatory Visit: Payer: BC Managed Care – PPO | Admitting: Certified Registered Nurse Anesthetist

## 2020-06-11 ENCOUNTER — Other Ambulatory Visit: Payer: Self-pay

## 2020-06-11 DIAGNOSIS — Z79899 Other long term (current) drug therapy: Secondary | ICD-10-CM | POA: Insufficient documentation

## 2020-06-11 DIAGNOSIS — Z1211 Encounter for screening for malignant neoplasm of colon: Secondary | ICD-10-CM

## 2020-06-11 DIAGNOSIS — Z20822 Contact with and (suspected) exposure to covid-19: Secondary | ICD-10-CM | POA: Insufficient documentation

## 2020-06-11 DIAGNOSIS — K644 Residual hemorrhoidal skin tags: Secondary | ICD-10-CM | POA: Insufficient documentation

## 2020-06-11 DIAGNOSIS — Z880 Allergy status to penicillin: Secondary | ICD-10-CM | POA: Insufficient documentation

## 2020-06-11 DIAGNOSIS — Z7982 Long term (current) use of aspirin: Secondary | ICD-10-CM | POA: Insufficient documentation

## 2020-06-11 DIAGNOSIS — Z7984 Long term (current) use of oral hypoglycemic drugs: Secondary | ICD-10-CM | POA: Insufficient documentation

## 2020-06-11 DIAGNOSIS — Z791 Long term (current) use of non-steroidal anti-inflammatories (NSAID): Secondary | ICD-10-CM | POA: Insufficient documentation

## 2020-06-11 HISTORY — DX: Type 2 diabetes mellitus without complications: E11.9

## 2020-06-11 HISTORY — DX: Essential (primary) hypertension: I10

## 2020-06-11 HISTORY — PX: COLONOSCOPY WITH PROPOFOL: SHX5780

## 2020-06-11 HISTORY — DX: Hyperlipidemia, unspecified: E78.5

## 2020-06-11 LAB — GLUCOSE, CAPILLARY: Glucose-Capillary: 83 mg/dL (ref 70–99)

## 2020-06-11 SURGERY — COLONOSCOPY WITH PROPOFOL
Anesthesia: General

## 2020-06-11 MED ORDER — LIDOCAINE HCL (CARDIAC) PF 100 MG/5ML IV SOSY
PREFILLED_SYRINGE | INTRAVENOUS | Status: DC | PRN
  Administered 2020-06-11: 50 mg via INTRAVENOUS

## 2020-06-11 MED ORDER — PROPOFOL 500 MG/50ML IV EMUL
INTRAVENOUS | Status: AC
  Filled 2020-06-11: qty 50

## 2020-06-11 MED ORDER — SODIUM CHLORIDE 0.9 % IV SOLN: INTRAVENOUS | Status: DC

## 2020-06-11 MED ORDER — LIDOCAINE HCL (PF) 2 % IJ SOLN
INTRAMUSCULAR | Status: AC
  Filled 2020-06-11: qty 5

## 2020-06-11 MED ORDER — PROPOFOL 500 MG/50ML IV EMUL
INTRAVENOUS | Status: DC | PRN
  Administered 2020-06-11: 150 ug/kg/min via INTRAVENOUS

## 2020-06-11 MED ORDER — PROPOFOL 10 MG/ML IV BOLUS
INTRAVENOUS | Status: DC | PRN
  Administered 2020-06-11: 60 mg via INTRAVENOUS

## 2020-06-11 NOTE — Transfer of Care (Signed)
Immediate Anesthesia Transfer of Care Note  Patient: Victor Campos  Procedure(s) Performed: COLONOSCOPY WITH PROPOFOL (N/A )  Patient Location: PACU  Anesthesia Type:General  Level of Consciousness: drowsy  Airway & Oxygen Therapy: Patient Spontanous Breathing  Post-op Assessment: Report given to RN and Post -op Vital signs reviewed and stable  Post vital signs: Reviewed and stable  Last Vitals:  Vitals Value Taken Time  BP 84/55 06/11/20 0918  Temp    Pulse 76 06/11/20 0919  Resp 16 06/11/20 0919  SpO2 97 % 06/11/20 0919  Vitals shown include unvalidated device data.  Last Pain:  Vitals:   06/11/20 0752  TempSrc: Temporal  PainSc: 0-No pain         Complications: No complications documented.

## 2020-06-11 NOTE — Op Note (Signed)
[facility] Patient Name: Victor Campos Procedure Date: 06/11/2020 8:49 AM MRN: [id number] Account #: [id number] Date of Birth: [birth year] Admit Type: Outpatient Age: 50 Room: [facility] ENDO ROOM 4 Gender: Male Note Status: Finalized Procedure:             Colonoscopy Indications:           Screening for colorectal malignant neoplasm, This is                         the patient's first colonoscopy Providers:             Toney Reil MD, MD Referring MD:          Sylvie Farrier. Aycock MD (Referring MD) Medicines:             General Anesthesia Complications:         No immediate complications. Estimated blood loss: None. Procedure:             Pre-Anesthesia Assessment:                        - Prior to the procedure, a History and Physical was                         performed, and patient medications and allergies were                         reviewed. The patient is competent. The risks and                         benefits of the procedure and the sedation options and                         risks were discussed with the patient. All questions                         were answered and informed consent was obtained.                         Patient identification and proposed procedure were                         verified by the physician, the nurse, the                         anesthesiologist, the anesthetist and the technician                         in the pre-procedure area in the procedure room in the                         endoscopy suite. Mental Status Examination: alert and                         oriented. Airway Examination: normal oropharyngeal                         airway and neck mobility. Respiratory Examination:  clear to auscultation. CV Examination: normal.                         Prophylactic Antibiotics: The patient does not require                         prophylactic antibiotics. Prior Anticoagulants:  The                         patient has taken no previous anticoagulant or                         antiplatelet agents. ASA Grade Assessment: II - A                         patient with mild systemic disease. After reviewing                         the risks and benefits, the patient was deemed in                         satisfactory condition to undergo the procedure. The                         anesthesia plan was to use general anesthesia.                         Immediately prior to administration of medications,                         the patient was re-assessed for adequacy to receive                         sedatives. The heart rate, respiratory rate, oxygen                         saturations, blood pressure, adequacy of pulmonary                         ventilation, and response to care were monitored                         throughout the procedure. The physical status of the                         patient was re-assessed after the procedure.                        After obtaining informed consent, the colonoscope was                         passed under direct vision. Throughout the procedure,                         the patient's blood pressure, pulse, and oxygen                         saturations were monitored continuously. The  Colonoscope was introduced through the anus and                         advanced to the the cecum, identified by appendiceal                         orifice and ileocecal valve. The colonoscopy was                         performed without difficulty. The patient tolerated                         the procedure well. The quality of the bowel                         preparation was evaluated using the BBPS [facility] Bowel                         Preparation Scale) with scores of: Right Colon = 3,                         Transverse Colon = 3 and Left Colon = 3 (entire mucosa                         seen well with no residual  staining, small fragments                         of stool or opaque liquid). The total BBPS score                         equals 9. Findings:      The perianal and digital rectal examinations were normal. Pertinent       negatives include normal sphincter tone and no palpable rectal lesions.      The entire examined colon appeared normal.      Non-bleeding external hemorrhoids were found during retroflexion. The       hemorrhoids were medium-sized. Impression:            - The entire examined colon is normal.                        - Non-bleeding external hemorrhoids.                        - No specimens collected. Recommendation:        - Discharge patient to home (with escort).                        - Resume previous diet today.                        - Continue present medications.                        - Repeat colonoscopy in 10 years for screening                         purposes. Procedure Code(s):     --- Professional ---  A8341, Colorectal cancer screening; colonoscopy on                         individual not meeting criteria for high risk Diagnosis Code(s):     --- Professional ---                        Z12.11, Encounter for screening for malignant neoplasm                         of colon                        K64.4, Residual hemorrhoidal skin tags CPT copyright 2019 [employer]. All rights reserved. The codes documented in this report are preliminary and upon coder review may  be revised to meet current compliance requirements. Dr. Libby Maw Toney Reil MD, MD 06/11/2020 9:16:28 AM This report has been signed electronically. Number of Addenda: 0 Note Initiated On: 06/11/2020 8:49 AM Scope Withdrawal Time: 0 hours 13 minutes 58 seconds  Total Procedure Duration: 0 hours 16 minutes 3 seconds  Estimated Blood Loss:  Estimated blood loss: none.      [facility]

## 2020-06-11 NOTE — Anesthesia Preprocedure Evaluation (Signed)
Anesthesia Evaluation  Patient identified by MRN, date of birth, ID band Patient awake    Reviewed: Allergy & Precautions, H&P , NPO status , Patient's Chart, lab work & pertinent test results, reviewed documented beta blocker date and time   History of Anesthesia Complications Negative for: history of anesthetic complications  Airway Mallampati: III  TM Distance: >3 FB Neck ROM: full    Dental  (+) Dental Advidsory Given, Caps, Teeth Intact   Pulmonary neg pulmonary ROS,    Pulmonary exam normal breath sounds clear to auscultation       Cardiovascular Exercise Tolerance: Good hypertension, (-) angina(-) Past MI and (-) Cardiac Stents Normal cardiovascular exam(-) dysrhythmias (-) Valvular Problems/Murmurs Rhythm:regular Rate:Normal     Neuro/Psych negative neurological ROS  negative psych ROS   GI/Hepatic negative GI ROS, Neg liver ROS,   Endo/Other  diabetes  Renal/GU negative Renal ROS  negative genitourinary   Musculoskeletal   Abdominal   Peds  Hematology negative hematology ROS (+)   Anesthesia Other Findings Past Medical History: No date: Diabetes mellitus without complication (HCC) No date: Hyperlipemia No date: Hypertension   Reproductive/Obstetrics negative OB ROS                             Anesthesia Physical Anesthesia Plan  ASA: II  Anesthesia Plan: General   Post-op Pain Management:    Induction: Intravenous  PONV Risk Score and Plan: 2 and TIVA and Propofol infusion  Airway Management Planned: Natural Airway and Nasal Cannula  Additional Equipment:   Intra-op Plan:   Post-operative Plan:   Informed Consent: I have reviewed the patients History and Physical, chart, labs and discussed the procedure including the risks, benefits and alternatives for the proposed anesthesia with the patient or authorized representative who has indicated his/her understanding  and acceptance.     Dental Advisory Given  Plan Discussed with: Anesthesiologist, CRNA and Surgeon  Anesthesia Plan Comments:         Anesthesia Quick Evaluation

## 2020-06-11 NOTE — H&P (Signed)
Arlyss Repress, MD [address]  [address]  [place], [place] [address]  Main: [phone]  Fax: [phone] Pager: [phone]  Primary Care Physician:  Emogene Morgan, MD Primary Gastroenterologist:  Dr. Arlyss Repress  Pre-Procedure History & Physical: HPI:  Victor Campos is a 50 y.o. male is here for an colonoscopy.   Past Medical History:  Diagnosis Date  . Diabetes mellitus without complication (HCC)   . Hyperlipemia   . Hypertension     Past Surgical History:  Procedure Laterality Date  . APPENDECTOMY      Prior to Admission medications   Medication Sig Start Date End Date Taking? Authorizing Provider  aspirin EC 81 MG tablet Take 81 mg by mouth daily. Swallow whole.   Yes [provider]  atorvastatin (LIPITOR) 40 MG tablet Take 40 mg by mouth daily.   Yes [provider]  IBUPROFEN PO Take by mouth.   Yes [provider]  lisinopril (ZESTRIL) 2.5 MG tablet Take 2.5 mg by mouth daily.   Yes [provider]  metFORMIN (GLUCOPHAGE) 500 MG tablet Take by mouth 2 (two) times daily with a meal.   Yes [provider]  cyclobenzaprine (FLEXERIL) 10 MG tablet Take 1 tablet (10 mg total) by mouth 3 (three) times daily as needed for muscle spasms. Patient not taking: Reported on 06/11/2020 12/09/14   Darci Current, MD  oxyCODONE-acetaminophen (ROXICET) 5-325 MG tablet Take 1 tablet by mouth every 4 (four) hours as needed for severe pain. Patient not taking: No sig reported 08/02/15   Governor Rooks, MD    Allergies as of 05/29/2020 - Review Complete 05/29/2020  Allergen Reaction Noted  . Penicillins Rash 08/02/2015    History reviewed. No pertinent family history.  Social History   Socioeconomic History  . Marital status: Married    Spouse name: Not on file  . Number of children: Not on file  . Years of education: Not on file  . Highest education level: Not on file  Occupational History  . Not on file   Tobacco Use  . Smoking status: Never Smoker  . Smokeless tobacco: Never Used  Vaping Use  . Vaping Use: Never used  Substance and Sexual Activity  . Alcohol use: No  . Drug use: No  . Sexual activity: Not on file  Other Topics Concern  . Not on file  Social History Narrative  . Not on file   Social Determinants of Health   Financial Resource Strain: Not on file  Food Insecurity: Not on file  Transportation Needs: Not on file  Physical Activity: Not on file  Stress: Not on file  Social Connections: Not on file  Intimate Partner Violence: Not on file    Review of Systems: See HPI, otherwise negative ROS  Physical Exam: BP 125/79   Pulse 80   Temp (!) 97.3 F (36.3 C) (Temporal)   Resp 16   Ht 5\' 3"  (1.6 m)   Wt 86.2 kg   SpO2 99%   BMI 33.66 kg/m  General:   Alert,  pleasant and cooperative in NAD Head:  Normocephalic and atraumatic. Neck:  Supple; no masses or thyromegaly. Lungs:  Clear throughout to auscultation.    Heart:  Regular rate and rhythm. Abdomen:  Soft, nontender and nondistended. Normal bowel sounds, without guarding, and without rebound.   Neurologic:  Alert and  oriented x4;  grossly normal neurologically.  Impression/Plan: Victor Campos is here for an colonoscopy to be performed  for colon cancer screening  Risks, benefits, limitations, and alternatives regarding  colonoscopy have been reviewed with the patient.  Questions have been answered.  All parties agreeable.   Lannette Donath, MD  06/11/2020, 8:47 AM

## 2020-06-11 NOTE — Anesthesia Postprocedure Evaluation (Signed)
Anesthesia Post Note  Patient: [name]  Procedure(s) Performed: COLONOSCOPY WITH PROPOFOL (N/A )  Patient location during evaluation: Endoscopy Anesthesia Type: General Level of consciousness: awake and alert Pain management: pain level controlled Vital Signs Assessment: post-procedure vital signs reviewed and stable Respiratory status: spontaneous breathing, nonlabored ventilation, respiratory function stable and patient connected to nasal cannula oxygen Cardiovascular status: blood pressure returned to baseline and stable Postop Assessment: no apparent nausea or vomiting Anesthetic complications: no   No complications documented.   Last Vitals:  Vitals:   06/11/20 0940 06/11/20 1000  BP: 111/79 120/80  Pulse: 71 71  Resp: 15 16  Temp:    SpO2: 98% 98%    Last Pain:  Vitals:   06/11/20 0752  TempSrc: Temporal  PainSc: 0-No pain                 Lenard Simmer

## 2020-06-12 ENCOUNTER — Encounter: Payer: Self-pay | Admitting: Gastroenterology
